# Patient Record
Sex: Male | Born: 1985 | Race: White | Hispanic: No | Marital: Single | Smoking: Former smoker
Health system: Southern US, Community
[De-identification: ages and names within clinical notes are randomized; demographics above are authoritative.]

## PROBLEM LIST (undated history)

## (undated) HISTORY — PX: HAND SURGERY: SHX662

---

## 2008-09-24 ENCOUNTER — Encounter: Payer: Self-pay | Admitting: Emergency Medicine

## 2008-09-24 ENCOUNTER — Observation Stay (HOSPITAL_COMMUNITY): Admission: AD | Admit: 2008-09-24 | Discharge: 2008-09-25 | Payer: Self-pay

## 2010-10-30 LAB — URINALYSIS, ROUTINE W REFLEX MICROSCOPIC
Glucose, UA: NEGATIVE mg/dL
Ketones, ur: NEGATIVE mg/dL
Leukocytes, UA: NEGATIVE
Nitrite: NEGATIVE
Specific Gravity, Urine: 1.014 (ref 1.005–1.030)
pH: 6.5 (ref 5.0–8.0)

## 2010-10-30 LAB — URINE MICROSCOPIC-ADD ON

## 2010-10-30 LAB — POCT I-STAT, CHEM 8
BUN: 15 mg/dL (ref 6–23)
Calcium, Ion: 1.07 mmol/L — ABNORMAL LOW (ref 1.12–1.32)
Chloride: 105 mEq/L (ref 96–112)
HCT: 51 % (ref 39.0–52.0)
Potassium: 3.8 mEq/L (ref 3.5–5.1)
Sodium: 146 mEq/L — ABNORMAL HIGH (ref 135–145)

## 2010-10-30 LAB — ETHANOL: Alcohol, Ethyl (B): 285 mg/dL — ABNORMAL HIGH (ref 0–10)

## 2010-10-30 LAB — CBC
Hemoglobin: 16.4 g/dL (ref 13.0–17.0)
MCHC: 34.1 g/dL (ref 30.0–36.0)
Platelets: 252 10*3/uL (ref 150–400)
RDW: 12.7 % (ref 11.5–15.5)

## 2010-10-30 LAB — DIFFERENTIAL
Basophils Absolute: 0 10*3/uL (ref 0.0–0.1)
Basophils Relative: 0 % (ref 0–1)
Lymphocytes Relative: 7 % — ABNORMAL LOW (ref 12–46)
Monocytes Absolute: 1.4 10*3/uL — ABNORMAL HIGH (ref 0.1–1.0)
Neutro Abs: 13.7 10*3/uL — ABNORMAL HIGH (ref 1.7–7.7)
Neutrophils Relative %: 84 % — ABNORMAL HIGH (ref 43–77)

## 2010-10-30 LAB — RAPID URINE DRUG SCREEN, HOSP PERFORMED
Cocaine: NOT DETECTED
Tetrahydrocannabinol: POSITIVE — AB
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
## 2013-07-02 ENCOUNTER — Ambulatory Visit: Payer: Self-pay | Admitting: Internal Medicine

## 2013-07-02 VITALS — BP 128/80 | HR 66 | Temp 98.0°F | Resp 16 | Ht 76.0 in | Wt 202.0 lb

## 2013-07-02 DIAGNOSIS — G4700 Insomnia, unspecified: Secondary | ICD-10-CM

## 2013-07-02 DIAGNOSIS — F1020 Alcohol dependence, uncomplicated: Secondary | ICD-10-CM

## 2013-07-02 NOTE — Progress Notes (Signed)
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

## 2013-12-28 ENCOUNTER — Emergency Department (HOSPITAL_COMMUNITY)
Admission: EM | Admit: 2013-12-28 | Discharge: 2013-12-28 | Discharge status: Against Medical Advice | Payer: Self-pay | Attending: Emergency Medicine | Admitting: Emergency Medicine

## 2013-12-28 ENCOUNTER — Emergency Department (HOSPITAL_COMMUNITY): Payer: Self-pay

## 2013-12-28 ENCOUNTER — Encounter (HOSPITAL_COMMUNITY): Payer: Self-pay | Admitting: Radiology

## 2013-12-28 DIAGNOSIS — F1010 Alcohol abuse, uncomplicated: Secondary | ICD-10-CM | POA: Insufficient documentation

## 2013-12-28 DIAGNOSIS — S0081XA Abrasion of other part of head, initial encounter: Secondary | ICD-10-CM

## 2013-12-28 DIAGNOSIS — IMO0002 Reserved for concepts with insufficient information to code with codable children: Secondary | ICD-10-CM | POA: Insufficient documentation

## 2013-12-28 DIAGNOSIS — F17200 Nicotine dependence, unspecified, uncomplicated: Secondary | ICD-10-CM | POA: Insufficient documentation

## 2013-12-28 DIAGNOSIS — F10929 Alcohol use, unspecified with intoxication, unspecified: Secondary | ICD-10-CM

## 2013-12-28 DIAGNOSIS — S01309A Unspecified open wound of unspecified ear, initial encounter: Secondary | ICD-10-CM | POA: Insufficient documentation

## 2013-12-28 DIAGNOSIS — R000 Tachycardia, unspecified: Secondary | ICD-10-CM | POA: Insufficient documentation

## 2013-12-28 DIAGNOSIS — S0990XA Unspecified injury of head, initial encounter: Secondary | ICD-10-CM | POA: Insufficient documentation

## 2013-12-28 DIAGNOSIS — S01319A Laceration without foreign body of unspecified ear, initial encounter: Secondary | ICD-10-CM

## 2013-12-28 DIAGNOSIS — Y92410 Unspecified street and highway as the place of occurrence of the external cause: Secondary | ICD-10-CM | POA: Insufficient documentation

## 2013-12-28 DIAGNOSIS — F411 Generalized anxiety disorder: Secondary | ICD-10-CM | POA: Insufficient documentation

## 2013-12-28 DIAGNOSIS — R319 Hematuria, unspecified: Secondary | ICD-10-CM | POA: Insufficient documentation

## 2013-12-28 DIAGNOSIS — Y9389 Activity, other specified: Secondary | ICD-10-CM | POA: Insufficient documentation

## 2013-12-28 LAB — CBC WITH DIFFERENTIAL/PLATELET
BASOS PCT: 1 % (ref 0–1)
Basophils Absolute: 0 10*3/uL (ref 0.0–0.1)
EOS ABS: 0.1 10*3/uL (ref 0.0–0.7)
EOS PCT: 1 % (ref 0–5)
HCT: 43.3 % (ref 39.0–52.0)
HEMOGLOBIN: 15.1 g/dL (ref 13.0–17.0)
Lymphocytes Relative: 44 % (ref 12–46)
Lymphs Abs: 2.6 10*3/uL (ref 0.7–4.0)
MCH: 30.7 pg (ref 26.0–34.0)
MCHC: 34.9 g/dL (ref 30.0–36.0)
MCV: 88 fL (ref 78.0–100.0)
MONO ABS: 0.5 10*3/uL (ref 0.1–1.0)
MONOS PCT: 9 % (ref 3–12)
NEUTROS PCT: 45 % (ref 43–77)
Neutro Abs: 2.6 10*3/uL (ref 1.7–7.7)
Platelets: 193 10*3/uL (ref 150–400)
RBC: 4.92 MIL/uL (ref 4.22–5.81)
RDW: 12.8 % (ref 11.5–15.5)
WBC: 5.8 10*3/uL (ref 4.0–10.5)

## 2013-12-28 LAB — BASIC METABOLIC PANEL
BUN: 13 mg/dL (ref 6–23)
CALCIUM: 9.4 mg/dL (ref 8.4–10.5)
CO2: 24 mEq/L (ref 19–32)
CREATININE: 1.2 mg/dL (ref 0.50–1.35)
Chloride: 105 mEq/L (ref 96–112)
GFR, EST NON AFRICAN AMERICAN: 81 mL/min — AB (ref >90)
Glucose, Bld: 113 mg/dL — ABNORMAL HIGH (ref 70–99)
POTASSIUM: 4.2 meq/L (ref 3.7–5.3)
Sodium: 145 mEq/L (ref 137–147)

## 2013-12-28 LAB — ETHANOL: Alcohol, Ethyl (B): 307 mg/dL — ABNORMAL HIGH (ref 0–11)

## 2013-12-28 MED ORDER — SODIUM CHLORIDE 0.9 % IV BOLUS (SEPSIS)
1000.0000 mL | Freq: Once | INTRAVENOUS | Status: AC
  Administered 2013-12-28: 1000 mL via INTRAVENOUS

## 2013-12-28 MED ORDER — IOHEXOL 300 MG/ML  SOLN
100.0000 mL | Freq: Once | INTRAMUSCULAR | Status: AC | PRN
  Administered 2013-12-28: 100 mL via INTRAVENOUS

## 2013-12-28 MED ORDER — SODIUM CHLORIDE 0.9 % IV SOLN: INTRAVENOUS | Status: DC

## 2013-12-28 NOTE — Code Documentation (Signed)
Patient is leaving AMA, he signed paperwork, understands risks and benefits, explained to father reguarding possible complications and when to return to ER.  Patient teaching regarding keeping wounds clean and dry.  Father driving patient home and patient is staying with father tonight.
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
## 2013-12-28 NOTE — ED Notes (Signed)
Patient refused all PTA treatment. Patient refusing to put C-collar on at this time. Patient does not want to be here and wants to leave. RN attempting to redirect patient and calm him down.

## 2014-03-20 ENCOUNTER — Ambulatory Visit (INDEPENDENT_AMBULATORY_CARE_PROVIDER_SITE_OTHER): Payer: Self-pay | Admitting: Emergency Medicine

## 2014-03-20 VITALS — BP 110/74 | HR 77 | Temp 98.0°F | Resp 18 | Ht 74.5 in | Wt 197.0 lb

## 2014-03-20 DIAGNOSIS — L03818 Cellulitis of other sites: Secondary | ICD-10-CM

## 2014-03-20 DIAGNOSIS — L02818 Cutaneous abscess of other sites: Secondary | ICD-10-CM

## 2014-03-20 MED ORDER — SULFAMETHOXAZOLE-TMP DS 800-160 MG PO TABS: 1.0000 | ORAL_TABLET | Freq: Two times a day (BID) | ORAL | Status: AC

## 2014-03-20 NOTE — Progress Notes (Signed)
[facility] [address], [place] [address] [phone]  Date:  03/20/2014   Name:  Caleb Diaz   DOB:  Aug 03, 1985   MRN:  [id number]  PCP:  No PCP Per Patient    Chief Complaint: Elbow Pain   History of Present Illness:  Caleb Diaz is a 28 y.o. very pleasant male patient who presents with the following:  No history of injury.  Has red swollen tender left elbow.  No improvement with over the counter medications or other home remedies. Denies other complaint or health concern today.   Patient Active Problem List   Diagnosis Date Noted  . Alcoholism 07/02/2013    History reviewed. No pertinent past medical history.  Past Surgical History  Procedure Laterality Date  . Hand surgery Left     History  Substance Use Topics  . Smoking status: Former Smoker    Types: Cigarettes  . Smokeless tobacco: Not on file  . Alcohol Use: Not on file    History reviewed. No pertinent family history.  No Known Allergies  Medication list has been reviewed and updated.  No current outpatient prescriptions on file prior to visit.   No current facility-administered medications on file prior to visit.    Review of Systems:  As per HPI, otherwise negative.    Physical Examination: Filed Vitals:   03/20/14 1349  BP: 110/74  Pulse: 77  Temp: 98 F (36.7 C)  Resp: 18   Filed Vitals:   03/20/14 1349  Height: 6' 2.5" (1.892 m)  Weight: 197 lb (89.359 kg)   Body mass index is 24.96 kg/(m^2). Ideal Body Weight: Weight in (lb) to have BMI = 25: 196.9   GEN: WDWN, NAD, Non-toxic, Alert & Oriented x 3 HEENT: Atraumatic, Normocephalic.  Ears and Nose: No external deformity. EXTR: No clubbing/cyanosis/edema NEURO: Normal gait.  PSYCH: Normally interactive. Conversant. Not depressed or anxious appearing.  Calm demeanor.  LEFT elbow:  Cellulitis. No effusion.  No fever or chills  Assessment and Plan: Cellulitis elbow Septra  Signed,   Phillips Odor, MD

## 2014-03-20 NOTE — Patient Instructions (Signed)
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
## 2022-12-27 ENCOUNTER — Ambulatory Visit (HOSPITAL_COMMUNITY)
Admission: EM | Admit: 2022-12-27 | Discharge: 2022-12-27 | Disposition: A | Discharge status: Home / Self Care | Payer: Medicaid Other | Attending: Family Medicine | Admitting: Family Medicine

## 2022-12-27 DIAGNOSIS — F4320 Adjustment disorder, unspecified: Secondary | ICD-10-CM | POA: Insufficient documentation

## 2022-12-27 DIAGNOSIS — Z9152 Personal history of nonsuicidal self-harm: Secondary | ICD-10-CM | POA: Insufficient documentation

## 2022-12-27 DIAGNOSIS — F1029 Alcohol dependence with unspecified alcohol-induced disorder: Secondary | ICD-10-CM | POA: Insufficient documentation

## 2022-12-27 NOTE — ED Provider Notes (Signed)
[facility] Medical Screening Exam  Patient Name: Caleb Diaz MRN: [id number] Date of Evaluation: 12/27/22 Chief Complaint:  "My dad brought me here for a mental health evaluation" Diagnosis:  Final diagnoses:  Alcohol dependence with unspecified alcohol-induced disorder (HCC)  Adjustment disorder, unspecified type    History of Present illness:  Caleb Diaz 37 y.o., male patient presented to [facility] as a walk in voluntarily, accompanied by his father, Caleb Diaz, requesting a mental health evaluation. He prefers to be called by his middle name, "Caleb Diaz".  Caleb Diaz, 37 y.o., male patient seen face to face by this provider, consulted with Dr. Lucianne Muss; and chart reviewed on 12/27/22.    On evaluation Caleb Diaz reports that he has a history of ADHD, Depression (as an adolescent), and has struggled with alcohol dependence for most of his adult life. He reports that he has been in several addiction rehabilitation facilities with his last admission for treatment in December 2023. He endorses that he has noticed a increase in his intake of alcohol over the last several months with is current pattern of alcohol use consisting of abstaining from alcohol Sunday-Thursday and binge drinking Friday and Saturday. According to patient he is in general a loner and drinks alone and engages in solo hobbies such as hiking and working out in the gym for leisure activities. He reports that he has a great job and he feels that he drinks on the weekends due to boredom and alcohol is "numbing" so he doesn't have to deal with his feelings of frustration. When this writer inquired about what specifically is frustrating him, patient states, that he is frustrated with "The state of this country". He does on to state that his overall frustration with the country began after the election in 2016 of Garnet [place]an[place] and he felt that the "mainstream media" portrayed the president in a negative light and  told lies about him. 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 Patient also reports that his father doesn't understand him or feels that he is crazy when tries to discuss political content with him. Patient denies auditory or visual hallucinations. Patient endorses a history of one psychiatric admission at age 3 for self harm byway of cutting behaviors but denies any prior suicidal attempts or any self harming behavior by cutting as an adult. He denies suicidal or homicidal ideations at present or in recent past. He denies any history of suicide ideations or attempts. He has seen a therapist years ago but reports that he got "saved" a few years ago which changed his life. Patient endorse daily use of THC and Kratom. He reports these substances enable him to feel "calm". He reports that in the past he saw therapist and felt at the time counseling was beneficial and is open to restarting therapy. In addition, Caleb Diaz admits that in the past AA meetings were helpful however, he would have to link up with a new AA group.   During evaluation Caleb Diaz is sitting upright in no acute distress.  He is alert, oriented x 4, calm, cooperative and attentive.  His mood is euthymic with congruent affect.  He  has normal speech, and  behavior.  Objectively there is no evidence of psychosis/mania or delusional thinking.  Patient has fixed paranoid beliefs relating to the government and politics, however, patient is not responding to internal stimuli and exhibits no behaviors concerning for psychosis. He also denies suicidal/self-harm/homicidal ideation, psychosis, and paranoia.  Patient answered question appropriately and is able  to contract for safety. Patient gave this Clinical research associate permission to speak with his dad, Caleb Diaz who is sitting in the lobby.   Collateral information: Caleb Diaz, father , reports the has brought the patient in due to concerns that he is drinking to much alcohol again and isolating himself two days per week-Friday and Saturday to his room and binge drinking to the point of becoming drunk.  Father endorses that patient  is not any type of danger to himself or others, however father wanted some recommendations on how to assist son in achieving sobriety and moving out of his father's house to be able to successfully live independently.  Father  acknowledges that the patient does well when he is in treatment however, falls off once he return home.  Father is requesting resources for therapist.  Flowsheet Row [facility]  C-SSRS RISK CATEGORY No Risk       Psychiatric Specialty Exam  Presentation  General Appearance:Appropriate for Environment  Eye Contact:Good  Speech:Clear and Coherent  Speech Volume:Normal  Handedness:Right   Mood and Affect  Mood:Euthymic  Affect:Appropriate   Thought Process  Thought Processes:Irrevelant; Coherent  Descriptions of Associations:Circumstantial  Orientation:Full (Time, Place and Person)  Thought Content:Paranoid Ideation    Hallucinations:None  Ideas of Reference:None  Suicidal Thoughts:No  Homicidal Thoughts:No   Sensorium  Memory:Immediate Good; Recent Good; Remote Good  Judgment:Good  Insight:Good   Executive Functions  Concentration:Good  Attention Span:Good  Recall:Good  Fund of Knowledge:Good  Language:Good   Psychomotor Activity  Psychomotor Activity:Normal   Assets  Assets:Communication Skills; Desire for Improvement; Housing; Physical Health; Resilience; Social Support   Sleep  Sleep:Good  Number of hours: No data recorded  Physical Exam: Physical Exam Vitals  reviewed.  Constitutional:      Appearance: Normal appearance.  HENT:     Head: Normocephalic and atraumatic.  Eyes:     Extraocular Movements: Extraocular movements intact.     Pupils: Pupils are equal, round, and reactive to light.  Cardiovascular:     Rate and Rhythm: Normal rate and regular rhythm.  Pulmonary:     Effort: Pulmonary effort is normal.     Breath sounds: Normal breath sounds.  Skin:    Capillary Refill: Capillary refill takes less than 2 seconds.  Neurological:     General: No focal deficit present.     Mental Status: He is alert.     Review of Systems  Psychiatric/Behavioral:  Positive for substance abuse. Negative for depression, hallucinations, memory loss and suicidal ideas. The patient is not nervous/anxious and does not have insomnia.    Blood pressure (!) 147/91, pulse 98, temperature 98.7 F (37.1 C), temperature source Oral, resp. rate 19, SpO2 95 %. There is no height or weight on file to calculate BMI.  [facility] Discharge Disposition for Follow up and Recommendations: Based on my evaluation the patient does not appear to have an emergency medical condition and can be discharged with resources and follow up care in outpatient services for Individual Therapy and Group Therapy   Joaquin Courts, NP-C 12/27/2022, 10:46 AM

## 2022-12-27 NOTE — Progress Notes (Signed)
   12/27/22 0959  [facility] Triage Screening (Walk-ins at [facility] only)  How Did You Hear About [place]? Family/Friend  What Is the Reason for Your Visit/Call Today? Patient is 37 year old male who presents voluntarily to [facility] for substance use. He states that he consumes the equivalent to 12 beers daily due to the alcohol content. He also consumes THCA daily which he states is equivalent to 1/2 gram daily. Patient reports being diagnosed as a child with ADHD, and depression. He had been prescribed Adderall and Vyvanse in the past. He is not currently on any medication for his mental health. Pt reports he began using substances around age [age] due to his mom being a "big pothead." Pt denies SI/HI/or AVH. He appears to be alert and oriented x5. He is casually dressed and cooperative. Patient would like help for his substance use.  How Long Has This Been Causing You Problems? > than 6 months  Are You Planning to Commit Suicide/Harm Yourself At This time? No  Are You Planning To Harm Someone At This Time? No  Are you currently experiencing any auditory, visual or other hallucinations? No  Have You Used Any Alcohol or Drugs in the Past 24 Hours? Yes  How long ago did you use Drugs or Alcohol? yesterday- alcohol and THCA  What Did You Use and How Much? the equivalent of 12 beers  and the  equivaleent of 1/2 gram of marijuana  Do you have any current medical co-morbidities that require immediate attention? No  Clinician description of patient physical appearance/behavior: casually dressed  What Do You Feel Would Help You the Most Today? Alcohol or Drug Use Treatment  Determination of Need Routine (7 days)  Options For Referral Facility-Based Crisis;Outpatient Therapy

## 2022-12-27 NOTE — Discharge Instructions (Signed)
   Same-day appointments Operating hours and clinician availability may delay appointments until the next business day. [facility] New and Current Patients  Psychiatry hours Monday-Friday, 8am-4:30pm (Eastern Time) If you are experiencing problems accessing [facility] send email to: [email or web address] or call [phone], Monday-Friday, 8:00am-4:30pm If you are having a psychiatric or medical emergency, please call 911 or go to the nearest emergency department. To reach the Suicide and Crisis Lifeline, please call or text [phone].  What is [facility]?  [facility] is an online service that provides same-day access to psychiatry to meet urgent mental health needs. The goal is to provide patients with timely intervention and keep them in an outpatient setting.  How long will I wait to see a clinician?  Our goal is to provide same-day care. However, operating hours and clinician availability may delay appointments until the next business day.  What if I can't get an appointment?  Please call [facility] at [phone] 8am to 5pm Eastern Time or email us:  [email or web address]  to request the next available time. If you are having a psychiatric or medical emergency, please call 911 or go to the nearest emergency department. To reach the Suicide and Crisis Lifeline, please call or text [phone]. Do you accept insurance?  Yes. We accept most commercial insurance plans. What information do you need from me? New patients should be ready to provide:  Photo ID  Insurance card  Payment information  For current patients, our specialists will confirm your documentation is on file.   Can I continue with regular care after my [facility] session?  Yes. Our goal is to make sure you have the follow-up care you need. Our specialist can help you schedule an appointment with an ongoing provider in addition to your [facility] Dem[facility] session.  Can my current clinician provide  treatment on [facility]?  No. Our [facility] clinicians are trained to assist with more immediate needs and provide support between regular appointments with your clinician.  What device can I use to connect with [facility]?  You can use any Wi-Fi-enabled device with a camera and a microphone, such as a smartphone, tablet, or computer.  Do I have to be at home to connect with [facility]?  No. As long as you are located within [place] or [place] you can connect with a provider. We do request that you connect from a safe and private location. Your clinician is required to document your location for emergency purposes.  

## 2022-12-27 NOTE — ED Notes (Signed)
Pt discharged with  AVS.  AVS reviewed prior to discharge.  Pt alert, oriented, and ambulatory.  Safety maintained.  °
